# Patient Record
Sex: Male | Born: 1935 | Race: White | Hispanic: No | Marital: Married
Health system: Southern US, Community
[De-identification: ages and names within clinical notes are randomized; demographics above are authoritative.]

---

## 2007-07-19 ENCOUNTER — Encounter: Admission: RE | Admit: 2007-07-19 | Discharge: 2007-07-19 | Payer: Self-pay | Admitting: Unknown Physician Specialty

## 2014-05-07 DIAGNOSIS — N400 Enlarged prostate without lower urinary tract symptoms: Secondary | ICD-10-CM | POA: Diagnosis not present

## 2014-05-23 DIAGNOSIS — I2510 Atherosclerotic heart disease of native coronary artery without angina pectoris: Secondary | ICD-10-CM | POA: Diagnosis not present

## 2014-05-23 DIAGNOSIS — Z951 Presence of aortocoronary bypass graft: Secondary | ICD-10-CM | POA: Diagnosis not present

## 2014-05-23 DIAGNOSIS — E782 Mixed hyperlipidemia: Secondary | ICD-10-CM | POA: Diagnosis not present

## 2014-05-23 DIAGNOSIS — I480 Paroxysmal atrial fibrillation: Secondary | ICD-10-CM | POA: Diagnosis not present

## 2014-10-02 DIAGNOSIS — E039 Hypothyroidism, unspecified: Secondary | ICD-10-CM | POA: Diagnosis not present

## 2014-10-10 DIAGNOSIS — Z8679 Personal history of other diseases of the circulatory system: Secondary | ICD-10-CM | POA: Diagnosis not present

## 2014-10-10 DIAGNOSIS — Z0000 Encounter for general adult medical examination without abnormal findings: Secondary | ICD-10-CM | POA: Diagnosis not present

## 2014-10-10 DIAGNOSIS — J309 Allergic rhinitis, unspecified: Secondary | ICD-10-CM | POA: Diagnosis not present

## 2014-10-10 DIAGNOSIS — R972 Elevated prostate specific antigen [PSA]: Secondary | ICD-10-CM | POA: Diagnosis not present

## 2014-10-10 DIAGNOSIS — K5790 Diverticulosis of intestine, part unspecified, without perforation or abscess without bleeding: Secondary | ICD-10-CM | POA: Diagnosis not present

## 2014-10-10 DIAGNOSIS — Z23 Encounter for immunization: Secondary | ICD-10-CM | POA: Diagnosis not present

## 2014-10-10 DIAGNOSIS — R768 Other specified abnormal immunological findings in serum: Secondary | ICD-10-CM | POA: Diagnosis not present

## 2014-10-10 DIAGNOSIS — E780 Pure hypercholesterolemia: Secondary | ICD-10-CM | POA: Diagnosis not present

## 2014-12-26 DIAGNOSIS — D120 Benign neoplasm of cecum: Secondary | ICD-10-CM | POA: Diagnosis not present

## 2014-12-26 DIAGNOSIS — D125 Benign neoplasm of sigmoid colon: Secondary | ICD-10-CM | POA: Diagnosis not present

## 2014-12-26 DIAGNOSIS — Z86010 Personal history of colonic polyps: Secondary | ICD-10-CM | POA: Diagnosis not present

## 2014-12-26 DIAGNOSIS — Z1211 Encounter for screening for malignant neoplasm of colon: Secondary | ICD-10-CM | POA: Diagnosis not present

## 2014-12-26 DIAGNOSIS — D123 Benign neoplasm of transverse colon: Secondary | ICD-10-CM | POA: Diagnosis not present

## 2015-01-17 DIAGNOSIS — Z23 Encounter for immunization: Secondary | ICD-10-CM | POA: Diagnosis not present

## 2015-03-27 DIAGNOSIS — Z0100 Encounter for examination of eyes and vision without abnormal findings: Secondary | ICD-10-CM | POA: Diagnosis not present

## 2015-04-01 DIAGNOSIS — E039 Hypothyroidism, unspecified: Secondary | ICD-10-CM | POA: Diagnosis not present

## 2015-04-09 DIAGNOSIS — E782 Mixed hyperlipidemia: Secondary | ICD-10-CM | POA: Diagnosis not present

## 2015-04-09 DIAGNOSIS — N400 Enlarged prostate without lower urinary tract symptoms: Secondary | ICD-10-CM | POA: Diagnosis not present

## 2015-04-09 DIAGNOSIS — D649 Anemia, unspecified: Secondary | ICD-10-CM | POA: Diagnosis not present

## 2015-04-09 DIAGNOSIS — R972 Elevated prostate specific antigen [PSA]: Secondary | ICD-10-CM | POA: Diagnosis not present

## 2015-04-09 DIAGNOSIS — I10 Essential (primary) hypertension: Secondary | ICD-10-CM | POA: Diagnosis not present

## 2015-04-09 DIAGNOSIS — E7800 Pure hypercholesterolemia, unspecified: Secondary | ICD-10-CM | POA: Diagnosis not present

## 2015-04-09 DIAGNOSIS — K5790 Diverticulosis of intestine, part unspecified, without perforation or abscess without bleeding: Secondary | ICD-10-CM | POA: Diagnosis not present

## 2015-04-09 DIAGNOSIS — E039 Hypothyroidism, unspecified: Secondary | ICD-10-CM | POA: Diagnosis not present

## 2015-07-05 DIAGNOSIS — E782 Mixed hyperlipidemia: Secondary | ICD-10-CM | POA: Diagnosis not present

## 2015-07-05 DIAGNOSIS — I2510 Atherosclerotic heart disease of native coronary artery without angina pectoris: Secondary | ICD-10-CM | POA: Diagnosis not present

## 2015-07-09 DIAGNOSIS — E039 Hypothyroidism, unspecified: Secondary | ICD-10-CM | POA: Diagnosis not present

## 2015-07-25 ENCOUNTER — Other Ambulatory Visit: Payer: Self-pay | Admitting: Unknown Physician Specialty

## 2015-07-25 ENCOUNTER — Ambulatory Visit (INDEPENDENT_AMBULATORY_CARE_PROVIDER_SITE_OTHER): Payer: Medicare Other

## 2015-07-25 DIAGNOSIS — S0012XA Contusion of left eyelid and periocular area, initial encounter: Secondary | ICD-10-CM | POA: Diagnosis not present

## 2015-07-25 DIAGNOSIS — T1490XA Injury, unspecified, initial encounter: Secondary | ICD-10-CM

## 2015-07-25 DIAGNOSIS — S0240FA Zygomatic fracture, left side, initial encounter for closed fracture: Secondary | ICD-10-CM | POA: Diagnosis not present

## 2015-07-25 DIAGNOSIS — S01412A Laceration without foreign body of left cheek and temporomandibular area, initial encounter: Secondary | ICD-10-CM | POA: Diagnosis not present

## 2015-07-25 DIAGNOSIS — S0232XA Fracture of orbital floor, left side, initial encounter for closed fracture: Secondary | ICD-10-CM | POA: Diagnosis not present

## 2015-07-25 DIAGNOSIS — W2209XA Striking against other stationary object, initial encounter: Secondary | ICD-10-CM | POA: Diagnosis not present

## 2015-07-25 DIAGNOSIS — S02402A Zygomatic fracture, unspecified, initial encounter for closed fracture: Secondary | ICD-10-CM | POA: Diagnosis not present

## 2015-07-25 DIAGNOSIS — X58XXXA Exposure to other specified factors, initial encounter: Secondary | ICD-10-CM | POA: Diagnosis not present

## 2015-07-26 DIAGNOSIS — S02402A Zygomatic fracture, unspecified, initial encounter for closed fracture: Secondary | ICD-10-CM | POA: Diagnosis not present

## 2015-07-31 DIAGNOSIS — H43812 Vitreous degeneration, left eye: Secondary | ICD-10-CM | POA: Diagnosis not present

## 2015-07-31 DIAGNOSIS — H1132 Conjunctival hemorrhage, left eye: Secondary | ICD-10-CM | POA: Diagnosis not present

## 2015-09-10 DIAGNOSIS — Z0000 Encounter for general adult medical examination without abnormal findings: Secondary | ICD-10-CM | POA: Diagnosis not present

## 2015-09-10 DIAGNOSIS — I2510 Atherosclerotic heart disease of native coronary artery without angina pectoris: Secondary | ICD-10-CM | POA: Diagnosis not present

## 2015-09-10 DIAGNOSIS — N400 Enlarged prostate without lower urinary tract symptoms: Secondary | ICD-10-CM | POA: Diagnosis not present

## 2015-09-10 DIAGNOSIS — I480 Paroxysmal atrial fibrillation: Secondary | ICD-10-CM | POA: Diagnosis not present

## 2015-09-10 DIAGNOSIS — E785 Hyperlipidemia, unspecified: Secondary | ICD-10-CM | POA: Diagnosis not present

## 2015-09-11 DIAGNOSIS — E7800 Pure hypercholesterolemia, unspecified: Secondary | ICD-10-CM | POA: Diagnosis not present

## 2015-09-11 DIAGNOSIS — I480 Paroxysmal atrial fibrillation: Secondary | ICD-10-CM | POA: Diagnosis not present

## 2015-09-11 DIAGNOSIS — I2510 Atherosclerotic heart disease of native coronary artery without angina pectoris: Secondary | ICD-10-CM | POA: Diagnosis not present

## 2015-09-11 DIAGNOSIS — Z125 Encounter for screening for malignant neoplasm of prostate: Secondary | ICD-10-CM | POA: Diagnosis not present

## 2015-09-11 DIAGNOSIS — N400 Enlarged prostate without lower urinary tract symptoms: Secondary | ICD-10-CM | POA: Diagnosis not present

## 2015-09-11 DIAGNOSIS — Z0000 Encounter for general adult medical examination without abnormal findings: Secondary | ICD-10-CM | POA: Diagnosis not present

## 2015-10-02 DIAGNOSIS — E039 Hypothyroidism, unspecified: Secondary | ICD-10-CM | POA: Diagnosis not present

## 2015-12-04 DIAGNOSIS — H353131 Nonexudative age-related macular degeneration, bilateral, early dry stage: Secondary | ICD-10-CM | POA: Diagnosis not present

## 2016-01-13 ENCOUNTER — Other Ambulatory Visit: Payer: Self-pay | Admitting: Pharmacist

## 2016-01-13 NOTE — Patient Outreach (Signed)
Outreach call to Jimmy Gilbert regarding his request for follow up from the [facility] Medication Adherence Campaign. HIPAA identifiers verified and verbal consent received.   Patient reports that he has been taking his cholesterol medication as directed. Denies any barriers to taking his medications such as cost or side effects.   Patient reports that he has no medication questions or concerns at this time.  Harlow Asa, PharmD Clinical Pharmacist [facility] [phone]

## 2016-02-04 DIAGNOSIS — Z23 Encounter for immunization: Secondary | ICD-10-CM | POA: Diagnosis not present

## 2016-03-23 DIAGNOSIS — D649 Anemia, unspecified: Secondary | ICD-10-CM | POA: Diagnosis not present

## 2016-03-23 DIAGNOSIS — R972 Elevated prostate specific antigen [PSA]: Secondary | ICD-10-CM | POA: Diagnosis not present

## 2016-03-23 DIAGNOSIS — E785 Hyperlipidemia, unspecified: Secondary | ICD-10-CM | POA: Diagnosis not present

## 2016-03-23 DIAGNOSIS — I2510 Atherosclerotic heart disease of native coronary artery without angina pectoris: Secondary | ICD-10-CM | POA: Diagnosis not present

## 2016-04-01 DIAGNOSIS — E039 Hypothyroidism, unspecified: Secondary | ICD-10-CM | POA: Diagnosis not present

## 2016-08-03 DIAGNOSIS — E782 Mixed hyperlipidemia: Secondary | ICD-10-CM | POA: Diagnosis not present

## 2016-08-03 DIAGNOSIS — I2510 Atherosclerotic heart disease of native coronary artery without angina pectoris: Secondary | ICD-10-CM | POA: Diagnosis not present

## 2016-08-03 DIAGNOSIS — Z951 Presence of aortocoronary bypass graft: Secondary | ICD-10-CM | POA: Diagnosis not present

## 2016-08-03 DIAGNOSIS — I480 Paroxysmal atrial fibrillation: Secondary | ICD-10-CM | POA: Diagnosis not present

## 2016-08-03 DIAGNOSIS — I10 Essential (primary) hypertension: Secondary | ICD-10-CM | POA: Diagnosis not present

## 2016-09-04 DIAGNOSIS — H353131 Nonexudative age-related macular degeneration, bilateral, early dry stage: Secondary | ICD-10-CM | POA: Diagnosis not present

## 2016-09-16 DIAGNOSIS — E785 Hyperlipidemia, unspecified: Secondary | ICD-10-CM | POA: Diagnosis not present

## 2016-09-16 DIAGNOSIS — Z0000 Encounter for general adult medical examination without abnormal findings: Secondary | ICD-10-CM | POA: Diagnosis not present

## 2016-09-16 DIAGNOSIS — R972 Elevated prostate specific antigen [PSA]: Secondary | ICD-10-CM | POA: Diagnosis not present

## 2016-09-16 DIAGNOSIS — K5790 Diverticulosis of intestine, part unspecified, without perforation or abscess without bleeding: Secondary | ICD-10-CM | POA: Diagnosis not present

## 2016-09-16 DIAGNOSIS — I2510 Atherosclerotic heart disease of native coronary artery without angina pectoris: Secondary | ICD-10-CM | POA: Diagnosis not present

## 2016-09-18 DIAGNOSIS — R972 Elevated prostate specific antigen [PSA]: Secondary | ICD-10-CM | POA: Diagnosis not present

## 2016-09-18 DIAGNOSIS — N400 Enlarged prostate without lower urinary tract symptoms: Secondary | ICD-10-CM | POA: Diagnosis not present

## 2016-09-18 DIAGNOSIS — E785 Hyperlipidemia, unspecified: Secondary | ICD-10-CM | POA: Diagnosis not present

## 2016-09-18 DIAGNOSIS — Z125 Encounter for screening for malignant neoplasm of prostate: Secondary | ICD-10-CM | POA: Diagnosis not present

## 2016-10-01 DIAGNOSIS — I10 Essential (primary) hypertension: Secondary | ICD-10-CM | POA: Diagnosis not present

## 2016-10-01 DIAGNOSIS — E039 Hypothyroidism, unspecified: Secondary | ICD-10-CM | POA: Diagnosis not present

## 2016-10-01 DIAGNOSIS — I2510 Atherosclerotic heart disease of native coronary artery without angina pectoris: Secondary | ICD-10-CM | POA: Diagnosis not present

## 2016-11-05 IMAGING — CT CT MAXILLOFACIAL W/O CM
3 of 4 series · 15 of 47 positions shown, 18 images · non-contrast
Comparison: None.

CLINICAL DATA: Facial laceration after being hit with golf ball
today. No loss of consciousness.

EXAM:
CT MAXILLOFACIAL WITHOUT CONTRAST
TECHNIQUE: Multidetector CT imaging of the maxillofacial structures was
performed. Multiplanar CT image reconstructions were also generated.
A small metallic BB was placed on the right temple in order to
reliably differentiate right from left.

[Series 2: max soft · axial · 0.32mm/px · z∈[-181,-29]mm · 11 of 90 slices shown, 14 images]
[im 7/90  brain]
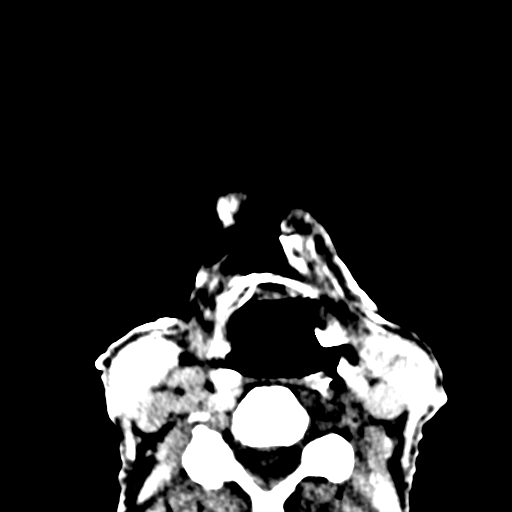
[im 7/90  bone]
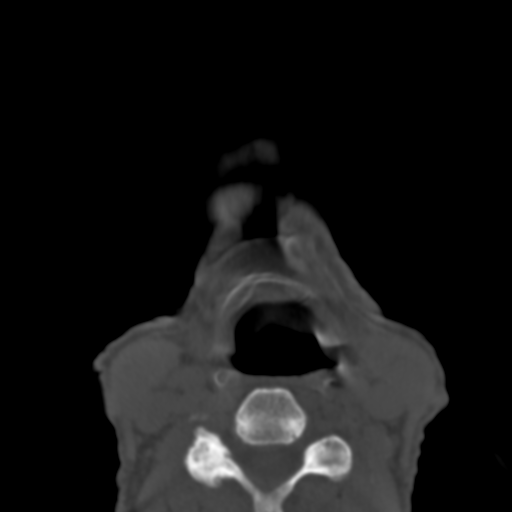
[im 13/90  bone]
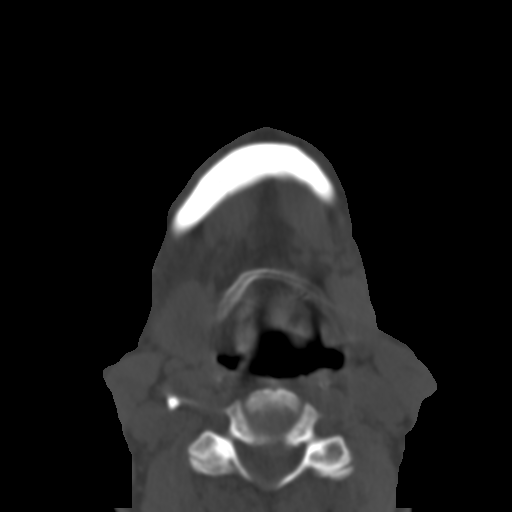
[im 22/90  bone]
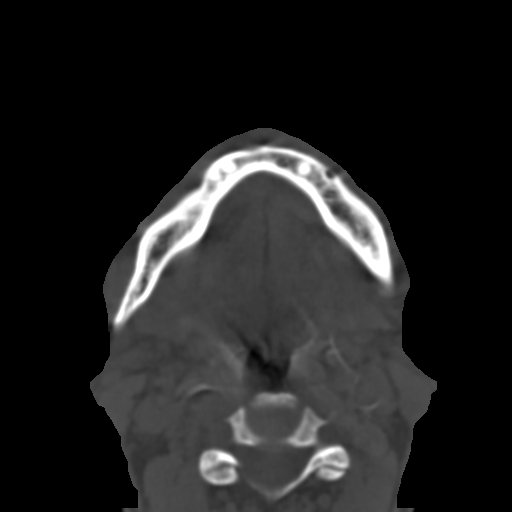
[im 28/90  bone]
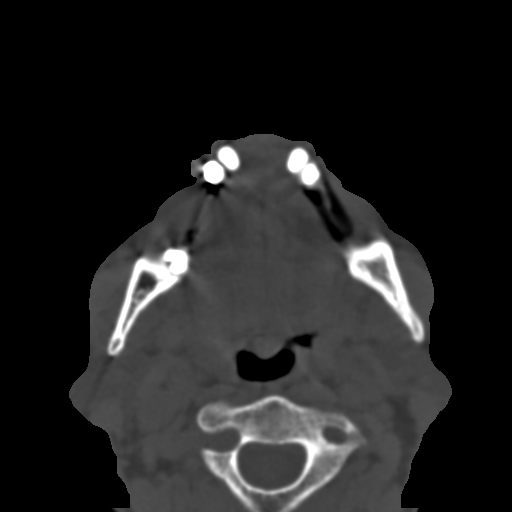
[im 37/90  brain]
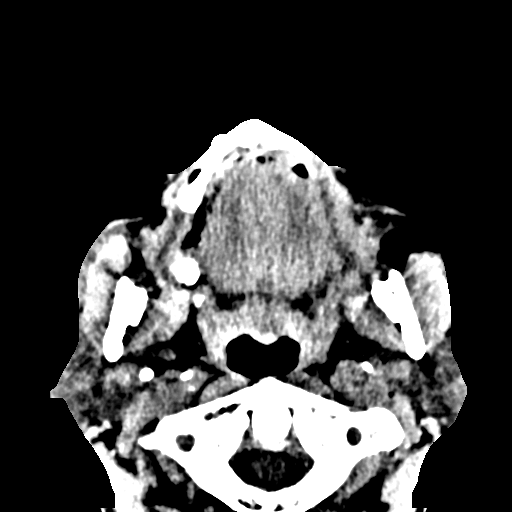
[im 37/90  bone]
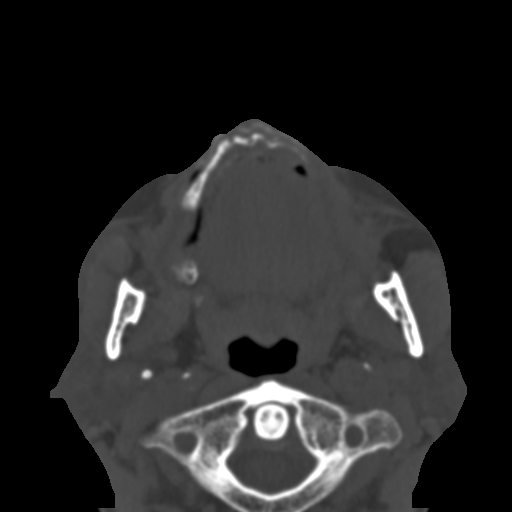
[im 47/90  bone]
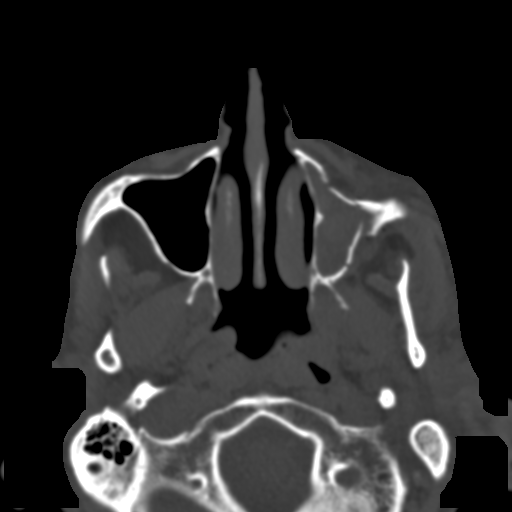
[im 53/90  bone]
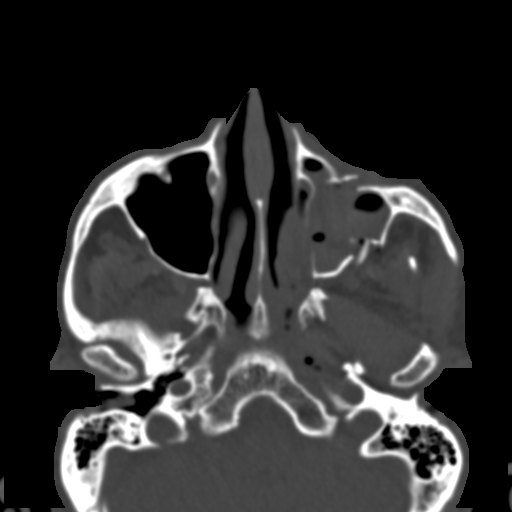
[im 62/90  bone]
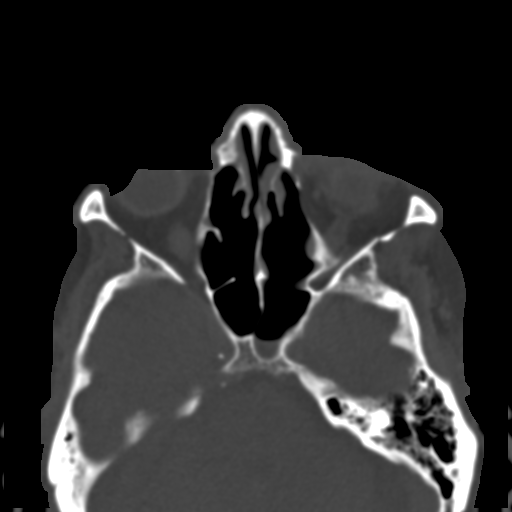
[im 68/90  brain]
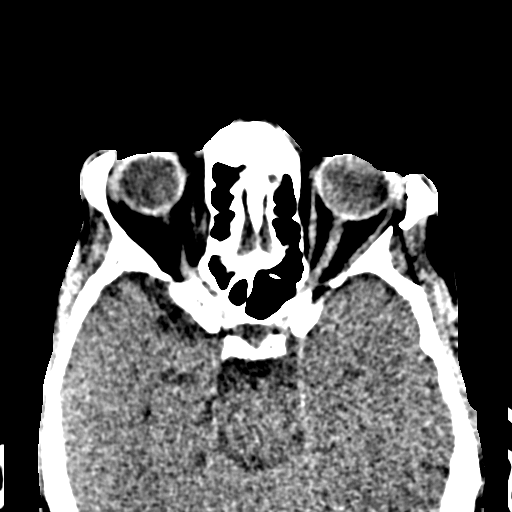
[im 68/90  bone]
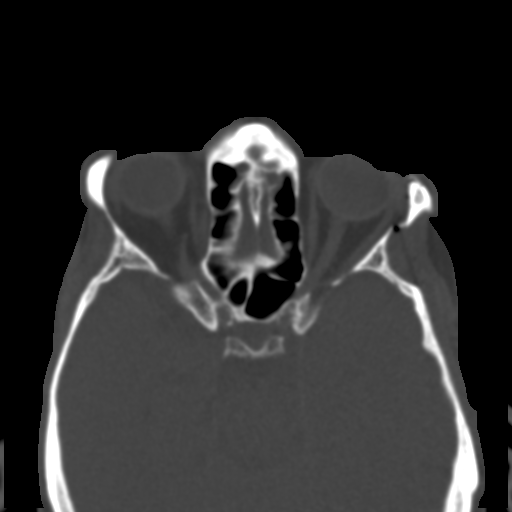
[im 77/90  bone]
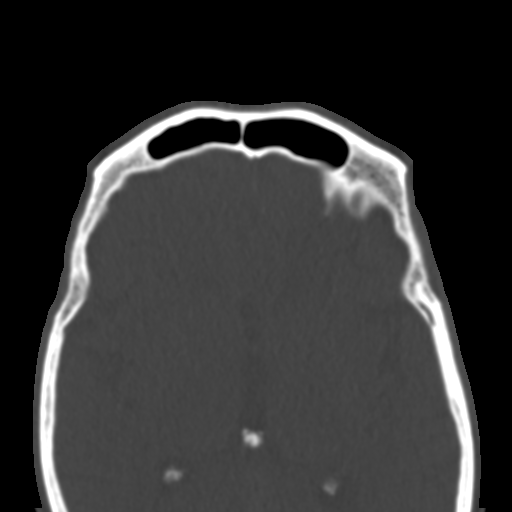
[im 83/90  bone]
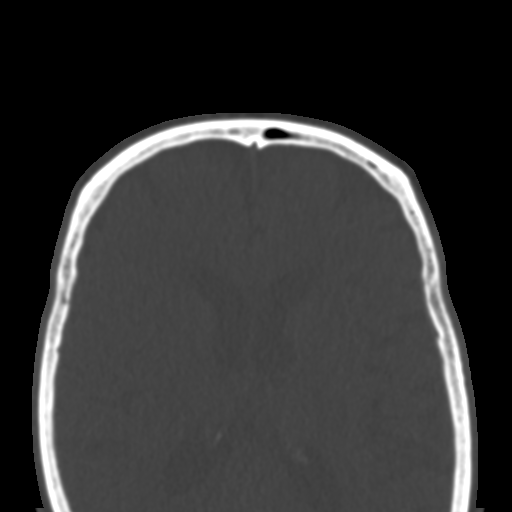

[Series 4: coronal soft · coronal · 0.32mm/px · 3 of 75 slices shown]
[im 33/75  bone]
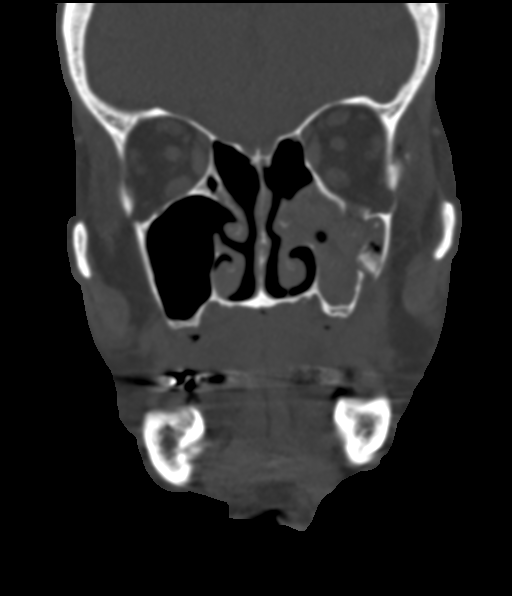
[im 42/75  bone]
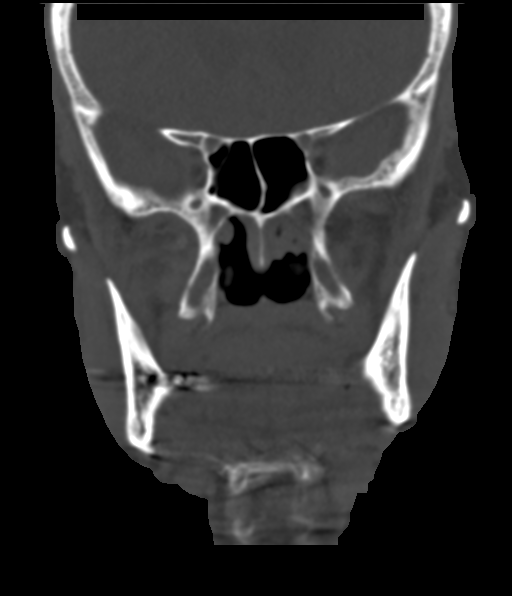
[im 50/75  bone]
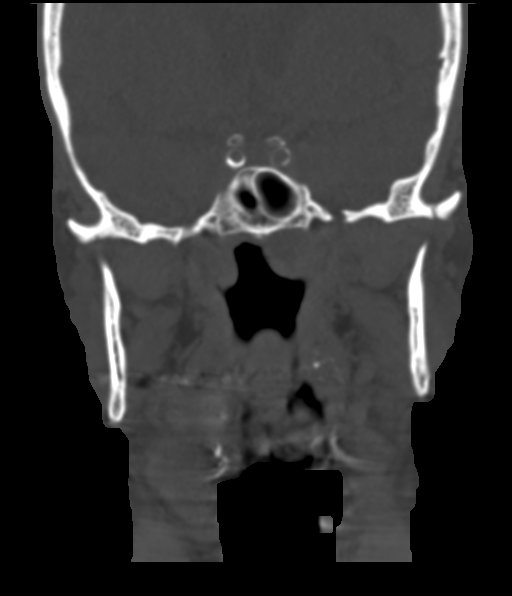

[Series 7: sagittal bone · sagittal · 0.30mm/px · 1 of 81 slices shown]
[im 41/81  bone]
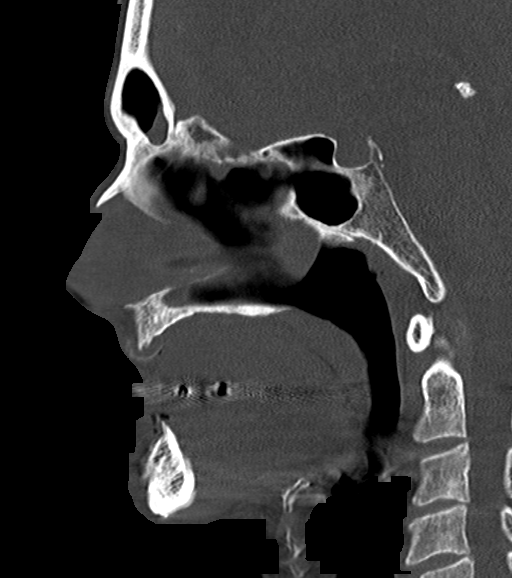

[15 of 47 positions shown; findings below may reference images not displayed]

FINDINGS: Nondisplaced fractures seen posteriorly in the left zygomatic arch,
with minimally displaced fracture seen in the midportion of the left
zygomatic arch. Severely comminuted and displaced fractures are seen
involving the anterior and posterior walls of the left maxillary
sinus. Hemorrhage is noted within the left maxillary sinus.
Remaining paranasal sinuses appear normal. Mildly displaced left
orbital floor fracture is noted. No definite evidence of entrapment
of extraocular muscles is noted on this exam.
IMPRESSION: Fractures involving the left zygomatic arch, anterior and posterior
walls of the maxillary sinus consistent with tripod fracture. Also
noted is mildly displaced left orbital floor fracture. These results
will be called to the ordering clinician or representative by the
Radiologist Assistant, and communication documented in the PACS or
[facility]Vision Dashboard.

## 2017-01-08 DIAGNOSIS — Z23 Encounter for immunization: Secondary | ICD-10-CM | POA: Diagnosis not present

## 2017-03-09 DIAGNOSIS — E039 Hypothyroidism, unspecified: Secondary | ICD-10-CM | POA: Diagnosis not present

## 2017-03-09 DIAGNOSIS — N401 Enlarged prostate with lower urinary tract symptoms: Secondary | ICD-10-CM | POA: Diagnosis not present

## 2017-03-09 DIAGNOSIS — R972 Elevated prostate specific antigen [PSA]: Secondary | ICD-10-CM | POA: Diagnosis not present

## 2017-03-09 DIAGNOSIS — I480 Paroxysmal atrial fibrillation: Secondary | ICD-10-CM | POA: Diagnosis not present

## 2017-03-09 DIAGNOSIS — I10 Essential (primary) hypertension: Secondary | ICD-10-CM | POA: Diagnosis not present

## 2017-03-09 DIAGNOSIS — E785 Hyperlipidemia, unspecified: Secondary | ICD-10-CM | POA: Diagnosis not present

## 2017-07-07 DIAGNOSIS — I252 Old myocardial infarction: Secondary | ICD-10-CM | POA: Diagnosis not present

## 2017-07-07 DIAGNOSIS — E039 Hypothyroidism, unspecified: Secondary | ICD-10-CM | POA: Diagnosis not present

## 2017-07-07 DIAGNOSIS — I2510 Atherosclerotic heart disease of native coronary artery without angina pectoris: Secondary | ICD-10-CM | POA: Diagnosis not present

## 2017-07-07 DIAGNOSIS — E785 Hyperlipidemia, unspecified: Secondary | ICD-10-CM | POA: Diagnosis not present

## 2017-07-07 DIAGNOSIS — R32 Unspecified urinary incontinence: Secondary | ICD-10-CM | POA: Diagnosis not present

## 2017-07-07 DIAGNOSIS — N400 Enlarged prostate without lower urinary tract symptoms: Secondary | ICD-10-CM | POA: Diagnosis not present

## 2017-07-07 DIAGNOSIS — Z720 Tobacco use: Secondary | ICD-10-CM | POA: Diagnosis not present

## 2017-07-07 DIAGNOSIS — N529 Male erectile dysfunction, unspecified: Secondary | ICD-10-CM | POA: Diagnosis not present

## 2017-07-07 DIAGNOSIS — I10 Essential (primary) hypertension: Secondary | ICD-10-CM | POA: Diagnosis not present

## 2017-07-07 DIAGNOSIS — Z7982 Long term (current) use of aspirin: Secondary | ICD-10-CM | POA: Diagnosis not present

## 2017-08-04 DIAGNOSIS — Z951 Presence of aortocoronary bypass graft: Secondary | ICD-10-CM | POA: Diagnosis not present

## 2017-08-04 DIAGNOSIS — E782 Mixed hyperlipidemia: Secondary | ICD-10-CM | POA: Diagnosis not present

## 2017-08-04 DIAGNOSIS — I2510 Atherosclerotic heart disease of native coronary artery without angina pectoris: Secondary | ICD-10-CM | POA: Diagnosis not present

## 2017-08-04 DIAGNOSIS — I480 Paroxysmal atrial fibrillation: Secondary | ICD-10-CM | POA: Diagnosis not present

## 2017-08-04 DIAGNOSIS — I10 Essential (primary) hypertension: Secondary | ICD-10-CM | POA: Diagnosis not present

## 2017-10-13 DIAGNOSIS — Z961 Presence of intraocular lens: Secondary | ICD-10-CM | POA: Diagnosis not present

## 2017-10-13 DIAGNOSIS — H527 Unspecified disorder of refraction: Secondary | ICD-10-CM | POA: Diagnosis not present

## 2017-10-13 DIAGNOSIS — H353131 Nonexudative age-related macular degeneration, bilateral, early dry stage: Secondary | ICD-10-CM | POA: Diagnosis not present

## 2017-10-13 DIAGNOSIS — H04123 Dry eye syndrome of bilateral lacrimal glands: Secondary | ICD-10-CM | POA: Diagnosis not present

## 2017-11-02 DIAGNOSIS — N401 Enlarged prostate with lower urinary tract symptoms: Secondary | ICD-10-CM | POA: Diagnosis not present

## 2017-11-02 DIAGNOSIS — R9721 Rising PSA following treatment for malignant neoplasm of prostate: Secondary | ICD-10-CM | POA: Diagnosis not present

## 2017-11-02 DIAGNOSIS — Z0000 Encounter for general adult medical examination without abnormal findings: Secondary | ICD-10-CM | POA: Diagnosis not present

## 2017-11-02 DIAGNOSIS — E039 Hypothyroidism, unspecified: Secondary | ICD-10-CM | POA: Diagnosis not present

## 2017-11-02 DIAGNOSIS — E785 Hyperlipidemia, unspecified: Secondary | ICD-10-CM | POA: Diagnosis not present

## 2017-12-08 DIAGNOSIS — Z125 Encounter for screening for malignant neoplasm of prostate: Secondary | ICD-10-CM | POA: Diagnosis not present

## 2017-12-08 DIAGNOSIS — Z1211 Encounter for screening for malignant neoplasm of colon: Secondary | ICD-10-CM | POA: Diagnosis not present

## 2017-12-08 DIAGNOSIS — E785 Hyperlipidemia, unspecified: Secondary | ICD-10-CM | POA: Diagnosis not present

## 2017-12-08 DIAGNOSIS — I2510 Atherosclerotic heart disease of native coronary artery without angina pectoris: Secondary | ICD-10-CM | POA: Diagnosis not present

## 2017-12-08 DIAGNOSIS — N401 Enlarged prostate with lower urinary tract symptoms: Secondary | ICD-10-CM | POA: Diagnosis not present

## 2017-12-08 DIAGNOSIS — R972 Elevated prostate specific antigen [PSA]: Secondary | ICD-10-CM | POA: Diagnosis not present

## 2017-12-08 DIAGNOSIS — E039 Hypothyroidism, unspecified: Secondary | ICD-10-CM | POA: Diagnosis not present

## 2017-12-08 DIAGNOSIS — I480 Paroxysmal atrial fibrillation: Secondary | ICD-10-CM | POA: Diagnosis not present

## 2017-12-10 DIAGNOSIS — I480 Paroxysmal atrial fibrillation: Secondary | ICD-10-CM | POA: Diagnosis not present

## 2017-12-10 DIAGNOSIS — I2510 Atherosclerotic heart disease of native coronary artery without angina pectoris: Secondary | ICD-10-CM | POA: Diagnosis not present

## 2017-12-10 DIAGNOSIS — E785 Hyperlipidemia, unspecified: Secondary | ICD-10-CM | POA: Diagnosis not present

## 2017-12-10 DIAGNOSIS — R413 Other amnesia: Secondary | ICD-10-CM | POA: Diagnosis not present

## 2018-01-07 DIAGNOSIS — E039 Hypothyroidism, unspecified: Secondary | ICD-10-CM | POA: Diagnosis not present

## 2018-01-12 DIAGNOSIS — Z23 Encounter for immunization: Secondary | ICD-10-CM | POA: Diagnosis not present

## 2018-01-14 DIAGNOSIS — I480 Paroxysmal atrial fibrillation: Secondary | ICD-10-CM | POA: Diagnosis not present

## 2018-01-14 DIAGNOSIS — I2510 Atherosclerotic heart disease of native coronary artery without angina pectoris: Secondary | ICD-10-CM | POA: Diagnosis not present

## 2018-01-14 DIAGNOSIS — Z951 Presence of aortocoronary bypass graft: Secondary | ICD-10-CM | POA: Diagnosis not present

## 2018-01-14 DIAGNOSIS — E782 Mixed hyperlipidemia: Secondary | ICD-10-CM | POA: Diagnosis not present

## 2018-04-29 DIAGNOSIS — I480 Paroxysmal atrial fibrillation: Secondary | ICD-10-CM | POA: Diagnosis not present

## 2018-04-29 DIAGNOSIS — R972 Elevated prostate specific antigen [PSA]: Secondary | ICD-10-CM | POA: Diagnosis not present

## 2018-04-29 DIAGNOSIS — E039 Hypothyroidism, unspecified: Secondary | ICD-10-CM | POA: Diagnosis not present

## 2018-04-29 DIAGNOSIS — E785 Hyperlipidemia, unspecified: Secondary | ICD-10-CM | POA: Diagnosis not present

## 2018-05-05 DIAGNOSIS — E785 Hyperlipidemia, unspecified: Secondary | ICD-10-CM | POA: Diagnosis not present

## 2018-05-05 DIAGNOSIS — R972 Elevated prostate specific antigen [PSA]: Secondary | ICD-10-CM | POA: Diagnosis not present

## 2018-05-05 DIAGNOSIS — E039 Hypothyroidism, unspecified: Secondary | ICD-10-CM | POA: Diagnosis not present

## 2018-10-19 DIAGNOSIS — H04123 Dry eye syndrome of bilateral lacrimal glands: Secondary | ICD-10-CM | POA: Diagnosis not present

## 2018-10-19 DIAGNOSIS — H52223 Regular astigmatism, bilateral: Secondary | ICD-10-CM | POA: Diagnosis not present

## 2018-10-19 DIAGNOSIS — H353131 Nonexudative age-related macular degeneration, bilateral, early dry stage: Secondary | ICD-10-CM | POA: Diagnosis not present

## 2018-10-19 DIAGNOSIS — H524 Presbyopia: Secondary | ICD-10-CM | POA: Diagnosis not present

## 2018-10-19 DIAGNOSIS — Z961 Presence of intraocular lens: Secondary | ICD-10-CM | POA: Diagnosis not present

## 2018-11-16 DIAGNOSIS — Z0000 Encounter for general adult medical examination without abnormal findings: Secondary | ICD-10-CM | POA: Diagnosis not present

## 2018-11-16 DIAGNOSIS — N401 Enlarged prostate with lower urinary tract symptoms: Secondary | ICD-10-CM | POA: Diagnosis not present

## 2018-11-16 DIAGNOSIS — E039 Hypothyroidism, unspecified: Secondary | ICD-10-CM | POA: Diagnosis not present

## 2018-11-16 DIAGNOSIS — R972 Elevated prostate specific antigen [PSA]: Secondary | ICD-10-CM | POA: Diagnosis not present

## 2018-11-16 DIAGNOSIS — E785 Hyperlipidemia, unspecified: Secondary | ICD-10-CM | POA: Diagnosis not present

## 2018-11-21 DIAGNOSIS — Z1211 Encounter for screening for malignant neoplasm of colon: Secondary | ICD-10-CM | POA: Diagnosis not present

## 2018-11-21 DIAGNOSIS — E039 Hypothyroidism, unspecified: Secondary | ICD-10-CM | POA: Diagnosis not present

## 2018-11-21 DIAGNOSIS — R972 Elevated prostate specific antigen [PSA]: Secondary | ICD-10-CM | POA: Diagnosis not present

## 2018-11-21 DIAGNOSIS — Z125 Encounter for screening for malignant neoplasm of prostate: Secondary | ICD-10-CM | POA: Diagnosis not present

## 2018-11-21 DIAGNOSIS — E785 Hyperlipidemia, unspecified: Secondary | ICD-10-CM | POA: Diagnosis not present

## 2019-01-27 DIAGNOSIS — I480 Paroxysmal atrial fibrillation: Secondary | ICD-10-CM | POA: Diagnosis not present

## 2019-01-27 DIAGNOSIS — I2510 Atherosclerotic heart disease of native coronary artery without angina pectoris: Secondary | ICD-10-CM | POA: Diagnosis not present

## 2019-01-27 DIAGNOSIS — Z951 Presence of aortocoronary bypass graft: Secondary | ICD-10-CM | POA: Diagnosis not present

## 2019-03-22 DIAGNOSIS — E785 Hyperlipidemia, unspecified: Secondary | ICD-10-CM | POA: Diagnosis not present

## 2019-03-22 DIAGNOSIS — Z7982 Long term (current) use of aspirin: Secondary | ICD-10-CM | POA: Diagnosis not present

## 2019-03-22 DIAGNOSIS — I252 Old myocardial infarction: Secondary | ICD-10-CM | POA: Diagnosis not present

## 2019-03-22 DIAGNOSIS — D6869 Other thrombophilia: Secondary | ICD-10-CM | POA: Diagnosis not present

## 2019-03-22 DIAGNOSIS — Z720 Tobacco use: Secondary | ICD-10-CM | POA: Diagnosis not present

## 2019-03-22 DIAGNOSIS — I4891 Unspecified atrial fibrillation: Secondary | ICD-10-CM | POA: Diagnosis not present

## 2019-03-22 DIAGNOSIS — E039 Hypothyroidism, unspecified: Secondary | ICD-10-CM | POA: Diagnosis not present

## 2019-03-22 DIAGNOSIS — I2510 Atherosclerotic heart disease of native coronary artery without angina pectoris: Secondary | ICD-10-CM | POA: Diagnosis not present

## 2019-03-22 DIAGNOSIS — Z809 Family history of malignant neoplasm, unspecified: Secondary | ICD-10-CM | POA: Diagnosis not present

## 2019-05-09 DIAGNOSIS — N401 Enlarged prostate with lower urinary tract symptoms: Secondary | ICD-10-CM | POA: Diagnosis not present

## 2019-05-09 DIAGNOSIS — E039 Hypothyroidism, unspecified: Secondary | ICD-10-CM | POA: Diagnosis not present

## 2019-05-09 DIAGNOSIS — I480 Paroxysmal atrial fibrillation: Secondary | ICD-10-CM | POA: Diagnosis not present

## 2019-05-09 DIAGNOSIS — E785 Hyperlipidemia, unspecified: Secondary | ICD-10-CM | POA: Diagnosis not present

## 2019-05-10 DIAGNOSIS — E785 Hyperlipidemia, unspecified: Secondary | ICD-10-CM | POA: Diagnosis not present

## 2019-05-10 DIAGNOSIS — R739 Hyperglycemia, unspecified: Secondary | ICD-10-CM | POA: Diagnosis not present

## 2019-05-29 DIAGNOSIS — R69 Illness, unspecified: Secondary | ICD-10-CM | POA: Diagnosis not present

## 2019-08-31 DIAGNOSIS — E785 Hyperlipidemia, unspecified: Secondary | ICD-10-CM | POA: Diagnosis not present

## 2019-08-31 DIAGNOSIS — I252 Old myocardial infarction: Secondary | ICD-10-CM | POA: Diagnosis not present

## 2019-08-31 DIAGNOSIS — K08409 Partial loss of teeth, unspecified cause, unspecified class: Secondary | ICD-10-CM | POA: Diagnosis not present

## 2019-08-31 DIAGNOSIS — E039 Hypothyroidism, unspecified: Secondary | ICD-10-CM | POA: Diagnosis not present

## 2019-08-31 DIAGNOSIS — I739 Peripheral vascular disease, unspecified: Secondary | ICD-10-CM | POA: Diagnosis not present

## 2019-08-31 DIAGNOSIS — K5900 Constipation, unspecified: Secondary | ICD-10-CM | POA: Diagnosis not present

## 2019-08-31 DIAGNOSIS — I4891 Unspecified atrial fibrillation: Secondary | ICD-10-CM | POA: Diagnosis not present

## 2019-08-31 DIAGNOSIS — I10 Essential (primary) hypertension: Secondary | ICD-10-CM | POA: Diagnosis not present

## 2019-08-31 DIAGNOSIS — Z008 Encounter for other general examination: Secondary | ICD-10-CM | POA: Diagnosis not present

## 2019-08-31 DIAGNOSIS — D6869 Other thrombophilia: Secondary | ICD-10-CM | POA: Diagnosis not present

## 2019-08-31 DIAGNOSIS — I2510 Atherosclerotic heart disease of native coronary artery without angina pectoris: Secondary | ICD-10-CM | POA: Diagnosis not present

## 2019-09-15 DIAGNOSIS — Z0100 Encounter for examination of eyes and vision without abnormal findings: Secondary | ICD-10-CM | POA: Diagnosis not present

## 2019-11-06 DIAGNOSIS — H52223 Regular astigmatism, bilateral: Secondary | ICD-10-CM | POA: Diagnosis not present

## 2019-11-06 DIAGNOSIS — H524 Presbyopia: Secondary | ICD-10-CM | POA: Diagnosis not present

## 2019-11-17 DIAGNOSIS — Z0000 Encounter for general adult medical examination without abnormal findings: Secondary | ICD-10-CM | POA: Diagnosis not present

## 2019-11-17 DIAGNOSIS — E785 Hyperlipidemia, unspecified: Secondary | ICD-10-CM | POA: Diagnosis not present

## 2019-11-17 DIAGNOSIS — R739 Hyperglycemia, unspecified: Secondary | ICD-10-CM | POA: Diagnosis not present

## 2019-11-17 DIAGNOSIS — I480 Paroxysmal atrial fibrillation: Secondary | ICD-10-CM | POA: Diagnosis not present

## 2019-11-17 DIAGNOSIS — E039 Hypothyroidism, unspecified: Secondary | ICD-10-CM | POA: Diagnosis not present

## 2019-11-27 DIAGNOSIS — E785 Hyperlipidemia, unspecified: Secondary | ICD-10-CM | POA: Diagnosis not present

## 2019-11-27 DIAGNOSIS — E039 Hypothyroidism, unspecified: Secondary | ICD-10-CM | POA: Diagnosis not present

## 2019-11-27 DIAGNOSIS — R739 Hyperglycemia, unspecified: Secondary | ICD-10-CM | POA: Diagnosis not present

## 2019-11-27 DIAGNOSIS — R5383 Other fatigue: Secondary | ICD-10-CM | POA: Diagnosis not present

## 2019-11-27 DIAGNOSIS — R972 Elevated prostate specific antigen [PSA]: Secondary | ICD-10-CM | POA: Diagnosis not present

## 2019-11-27 DIAGNOSIS — Z125 Encounter for screening for malignant neoplasm of prostate: Secondary | ICD-10-CM | POA: Diagnosis not present

## 2020-01-29 DIAGNOSIS — Z23 Encounter for immunization: Secondary | ICD-10-CM | POA: Diagnosis not present

## 2020-02-05 DIAGNOSIS — H353133 Nonexudative age-related macular degeneration, bilateral, advanced atrophic without subfoveal involvement: Secondary | ICD-10-CM | POA: Diagnosis not present

## 2020-02-05 DIAGNOSIS — H524 Presbyopia: Secondary | ICD-10-CM | POA: Diagnosis not present

## 2020-02-05 DIAGNOSIS — H353190 Nonexudative age-related macular degeneration, unspecified eye, stage unspecified: Secondary | ICD-10-CM | POA: Diagnosis not present

## 2020-02-05 DIAGNOSIS — Z135 Encounter for screening for eye and ear disorders: Secondary | ICD-10-CM | POA: Diagnosis not present

## 2020-02-14 DIAGNOSIS — E782 Mixed hyperlipidemia: Secondary | ICD-10-CM | POA: Diagnosis not present

## 2020-02-14 DIAGNOSIS — I2510 Atherosclerotic heart disease of native coronary artery without angina pectoris: Secondary | ICD-10-CM | POA: Diagnosis not present

## 2020-02-14 DIAGNOSIS — Z951 Presence of aortocoronary bypass graft: Secondary | ICD-10-CM | POA: Diagnosis not present

## 2020-02-14 DIAGNOSIS — I480 Paroxysmal atrial fibrillation: Secondary | ICD-10-CM | POA: Diagnosis not present

## 2020-05-08 DIAGNOSIS — R972 Elevated prostate specific antigen [PSA]: Secondary | ICD-10-CM | POA: Diagnosis not present

## 2020-05-08 DIAGNOSIS — E785 Hyperlipidemia, unspecified: Secondary | ICD-10-CM | POA: Diagnosis not present

## 2020-05-08 DIAGNOSIS — E039 Hypothyroidism, unspecified: Secondary | ICD-10-CM | POA: Diagnosis not present

## 2020-05-08 DIAGNOSIS — R739 Hyperglycemia, unspecified: Secondary | ICD-10-CM | POA: Diagnosis not present

## 2020-06-07 DIAGNOSIS — I480 Paroxysmal atrial fibrillation: Secondary | ICD-10-CM | POA: Diagnosis not present

## 2020-06-07 DIAGNOSIS — E039 Hypothyroidism, unspecified: Secondary | ICD-10-CM | POA: Diagnosis not present

## 2020-06-07 DIAGNOSIS — E782 Mixed hyperlipidemia: Secondary | ICD-10-CM | POA: Diagnosis not present

## 2020-06-07 DIAGNOSIS — Z7689 Persons encountering health services in other specified circumstances: Secondary | ICD-10-CM | POA: Diagnosis not present

## 2020-06-07 DIAGNOSIS — N400 Enlarged prostate without lower urinary tract symptoms: Secondary | ICD-10-CM | POA: Diagnosis not present

## 2020-06-07 DIAGNOSIS — N529 Male erectile dysfunction, unspecified: Secondary | ICD-10-CM | POA: Diagnosis not present

## 2020-07-11 DIAGNOSIS — E039 Hypothyroidism, unspecified: Secondary | ICD-10-CM | POA: Diagnosis not present

## 2020-07-11 DIAGNOSIS — R7989 Other specified abnormal findings of blood chemistry: Secondary | ICD-10-CM | POA: Diagnosis not present

## 2020-08-10 DIAGNOSIS — I10 Essential (primary) hypertension: Secondary | ICD-10-CM | POA: Diagnosis not present

## 2020-08-10 DIAGNOSIS — N400 Enlarged prostate without lower urinary tract symptoms: Secondary | ICD-10-CM | POA: Diagnosis not present

## 2020-08-10 DIAGNOSIS — I2510 Atherosclerotic heart disease of native coronary artery without angina pectoris: Secondary | ICD-10-CM | POA: Diagnosis not present

## 2020-08-10 DIAGNOSIS — I739 Peripheral vascular disease, unspecified: Secondary | ICD-10-CM | POA: Diagnosis not present

## 2020-08-10 DIAGNOSIS — N529 Male erectile dysfunction, unspecified: Secondary | ICD-10-CM | POA: Diagnosis not present

## 2020-08-10 DIAGNOSIS — M1990 Unspecified osteoarthritis, unspecified site: Secondary | ICD-10-CM | POA: Diagnosis not present

## 2020-08-10 DIAGNOSIS — E785 Hyperlipidemia, unspecified: Secondary | ICD-10-CM | POA: Diagnosis not present

## 2020-08-10 DIAGNOSIS — I252 Old myocardial infarction: Secondary | ICD-10-CM | POA: Diagnosis not present

## 2020-08-10 DIAGNOSIS — R32 Unspecified urinary incontinence: Secondary | ICD-10-CM | POA: Diagnosis not present

## 2020-08-10 DIAGNOSIS — Z008 Encounter for other general examination: Secondary | ICD-10-CM | POA: Diagnosis not present

## 2020-08-10 DIAGNOSIS — E039 Hypothyroidism, unspecified: Secondary | ICD-10-CM | POA: Diagnosis not present

## 2020-10-23 DIAGNOSIS — Z0000 Encounter for general adult medical examination without abnormal findings: Secondary | ICD-10-CM | POA: Diagnosis not present

## 2020-10-23 DIAGNOSIS — R634 Abnormal weight loss: Secondary | ICD-10-CM | POA: Diagnosis not present

## 2020-10-23 DIAGNOSIS — N400 Enlarged prostate without lower urinary tract symptoms: Secondary | ICD-10-CM | POA: Diagnosis not present

## 2020-10-23 DIAGNOSIS — E039 Hypothyroidism, unspecified: Secondary | ICD-10-CM | POA: Diagnosis not present

## 2020-10-23 DIAGNOSIS — E782 Mixed hyperlipidemia: Secondary | ICD-10-CM | POA: Diagnosis not present

## 2020-10-23 DIAGNOSIS — R413 Other amnesia: Secondary | ICD-10-CM | POA: Diagnosis not present

## 2020-10-23 DIAGNOSIS — I480 Paroxysmal atrial fibrillation: Secondary | ICD-10-CM | POA: Diagnosis not present

## 2020-12-20 DIAGNOSIS — R419 Unspecified symptoms and signs involving cognitive functions and awareness: Secondary | ICD-10-CM | POA: Diagnosis not present

## 2021-02-14 DIAGNOSIS — H524 Presbyopia: Secondary | ICD-10-CM | POA: Diagnosis not present

## 2021-03-19 DIAGNOSIS — H353133 Nonexudative age-related macular degeneration, bilateral, advanced atrophic without subfoveal involvement: Secondary | ICD-10-CM | POA: Diagnosis not present

## 2021-03-21 DIAGNOSIS — I2510 Atherosclerotic heart disease of native coronary artery without angina pectoris: Secondary | ICD-10-CM | POA: Diagnosis not present

## 2021-03-21 DIAGNOSIS — R0609 Other forms of dyspnea: Secondary | ICD-10-CM | POA: Diagnosis not present

## 2021-03-21 DIAGNOSIS — I480 Paroxysmal atrial fibrillation: Secondary | ICD-10-CM | POA: Diagnosis not present

## 2021-03-21 DIAGNOSIS — Z951 Presence of aortocoronary bypass graft: Secondary | ICD-10-CM | POA: Diagnosis not present

## 2021-03-21 DIAGNOSIS — E782 Mixed hyperlipidemia: Secondary | ICD-10-CM | POA: Diagnosis not present

## 2021-08-18 DEATH — deceased
